# Patient Record
Sex: Female | Born: 1960 | Race: Black or African American | Hispanic: No | Marital: Married | Smoking: Never smoker
Health system: Southern US, Community
[De-identification: ages and names within clinical notes are randomized; demographics above are authoritative.]

## PROBLEM LIST (undated history)

## (undated) DIAGNOSIS — M1990 Unspecified osteoarthritis, unspecified site: Secondary | ICD-10-CM

## (undated) DIAGNOSIS — I10 Essential (primary) hypertension: Secondary | ICD-10-CM

## (undated) HISTORY — DX: Essential (primary) hypertension: I10

## (undated) HISTORY — PX: EYE SURGERY: SHX253

## (undated) HISTORY — PX: ABDOMINAL HYSTERECTOMY: SHX81

## (undated) HISTORY — DX: Unspecified osteoarthritis, unspecified site: M19.90

---

## 2012-05-25 DIAGNOSIS — N920 Excessive and frequent menstruation with regular cycle: Secondary | ICD-10-CM | POA: Insufficient documentation

## 2012-05-25 DIAGNOSIS — Z6834 Body mass index (BMI) 34.0-34.9, adult: Secondary | ICD-10-CM | POA: Insufficient documentation

## 2012-05-25 DIAGNOSIS — D219 Benign neoplasm of connective and other soft tissue, unspecified: Secondary | ICD-10-CM | POA: Insufficient documentation

## 2012-05-25 DIAGNOSIS — Z1231 Encounter for screening mammogram for malignant neoplasm of breast: Secondary | ICD-10-CM | POA: Insufficient documentation

## 2013-02-05 DIAGNOSIS — R9431 Abnormal electrocardiogram [ECG] [EKG]: Secondary | ICD-10-CM | POA: Insufficient documentation

## 2015-08-12 DIAGNOSIS — R0789 Other chest pain: Secondary | ICD-10-CM | POA: Insufficient documentation

## 2018-12-30 DIAGNOSIS — M1710 Unilateral primary osteoarthritis, unspecified knee: Secondary | ICD-10-CM | POA: Insufficient documentation

## 2018-12-30 DIAGNOSIS — M179 Osteoarthritis of knee, unspecified: Secondary | ICD-10-CM | POA: Insufficient documentation

## 2020-08-28 ENCOUNTER — Encounter: Payer: Self-pay | Admitting: Adult Health

## 2020-09-21 ENCOUNTER — Encounter: Payer: Self-pay | Admitting: Adult Health

## 2020-09-21 ENCOUNTER — Ambulatory Visit (INDEPENDENT_AMBULATORY_CARE_PROVIDER_SITE_OTHER): Payer: No Typology Code available for payment source | Admitting: Adult Health

## 2020-09-21 ENCOUNTER — Other Ambulatory Visit: Payer: Self-pay

## 2020-09-21 VITALS — BP 109/62 | HR 86 | Temp 98.7°F | Resp 16 | Ht 64.5 in | Wt 204.0 lb

## 2020-09-21 DIAGNOSIS — K219 Gastro-esophageal reflux disease without esophagitis: Secondary | ICD-10-CM | POA: Insufficient documentation

## 2020-09-21 DIAGNOSIS — Z90710 Acquired absence of both cervix and uterus: Secondary | ICD-10-CM

## 2020-09-21 DIAGNOSIS — D649 Anemia, unspecified: Secondary | ICD-10-CM | POA: Insufficient documentation

## 2020-09-21 DIAGNOSIS — R87619 Unspecified abnormal cytological findings in specimens from cervix uteri: Secondary | ICD-10-CM | POA: Insufficient documentation

## 2020-09-21 DIAGNOSIS — G252 Other specified forms of tremor: Secondary | ICD-10-CM | POA: Diagnosis not present

## 2020-09-21 DIAGNOSIS — Z1231 Encounter for screening mammogram for malignant neoplasm of breast: Secondary | ICD-10-CM

## 2020-09-21 DIAGNOSIS — Z1389 Encounter for screening for other disorder: Secondary | ICD-10-CM

## 2020-09-21 DIAGNOSIS — Z8249 Family history of ischemic heart disease and other diseases of the circulatory system: Secondary | ICD-10-CM | POA: Insufficient documentation

## 2020-09-21 DIAGNOSIS — I10 Essential (primary) hypertension: Secondary | ICD-10-CM

## 2020-09-21 DIAGNOSIS — Z6834 Body mass index (BMI) 34.0-34.9, adult: Secondary | ICD-10-CM | POA: Diagnosis not present

## 2020-09-21 DIAGNOSIS — E559 Vitamin D deficiency, unspecified: Secondary | ICD-10-CM

## 2020-09-21 DIAGNOSIS — Z87898 Personal history of other specified conditions: Secondary | ICD-10-CM

## 2020-09-21 LAB — POCT URINALYSIS DIPSTICK
Blood, UA: NEGATIVE
Glucose, UA: NEGATIVE
Leukocytes, UA: NEGATIVE
Nitrite, UA: NEGATIVE
Protein, UA: POSITIVE — AB
Spec Grav, UA: >=1.03 — AB (ref 1.010–1.025)
Urobilinogen, UA: 0.2 E.U./dL
pH, UA: 5 (ref 5.0–8.0)

## 2020-09-21 MED ORDER — AMLODIPINE BESYLATE 5 MG PO TABS: 2.5000 mg | ORAL_TABLET | Freq: Every day | ORAL | 0 refills | Status: DC

## 2020-09-21 NOTE — Progress Notes (Signed)
New patient visit   Patient: Katherine Santos   DOB: 1961/02/18   60 y.o. Female  MRN: [id number] Visit Date: 09/21/2020  Today's healthcare provider: Marcille Buffy, FNP   Chief Complaint  Patient presents with  . New Patient (Initial Visit)   Subjective    Katherine Santos is a 60 y.o. female who presents today as a new patient to establish care.  HPI  Patient presents in office today to establish care, she states that she feels well today. Patient reports that she does follow a balanced diet of 2-3 meals a day and states that she is staying active by exercising weekly, patient reports that sleep patterns are fair and sleeps on average 6-8hrs a night.  She is taking amlodipine norvasc 5 mg.- not taking in two months.  She is also on spironolactone - HCTZ and she has been out of it for two months.She has had hypertension and now has lost 36 lbs since 2017 reports her blood pressures are around 120/70 or less now without medication.   She has been on the above medicines for years. Denies headaches. Denies any edema.   She only writes with her left hand everything else she does with her right hand.  Denies neck pain or injury.   She repots readings at home that are similar to what she is here in office.   Patinet has seen cardiologist and has had stress test and they were good.   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  Mother died at age 67 years old of myocardial infarction. Was seen at [facility].    Patient states that she would like to address today concern of trembling in her left hand that has been present for over one year. Patient states that her left hand is her dominant hand and states that she  Has noticed as of recent when she grabs an item/object with her left hand she drops it.   atorvastin- joint pain.   Patient  denies any fever, body aches,chills, rash, chest pain, shortness of breath, nausea, vomiting, or diarrhea.   Patient  denies any fever, body  aches,chills, rash, chest pain, shortness of breath, nausea, vomiting, or diarrhea.  Denies dizziness, lightheadedness, pre syncopal or syncopal episodes.   Past Medical History:  Diagnosis Date  . Arthritis   . Hypertension    Past Surgical History:  Procedure Laterality Date  . ABDOMINAL HYSTERECTOMY    . CESAREAN SECTION    . EYE SURGERY     Family Status  Relation Name Status  . Mother  (Not Specified)  . Father  (Not Specified)  . Son  (Not Specified)  . Mat Aunt  (Not Specified)   Family History  Problem Relation Age of Onset  . Hypertension Mother   . Heart attack Mother   . Anemia Mother   . Diabetes Father   . Hypertension Father   . Kidney disease Father   . Asthma Son   . Hypertension Maternal Aunt    Social History   Socioeconomic History  . Marital status: Married    Spouse name: Not on file  . Number of children: Not on file  . Years of education: Not on file  . Highest education level: Not on file  Occupational History  . Not on file  Tobacco Use  . Smoking status: Never Smoker  . Smokeless tobacco: Never Used  Substance and Sexual Activity  . Alcohol use: Never  . Drug use: Never  .  Sexual activity: Not on file  Other Topics Concern  . Not on file  Social History Narrative  . Not on file   Social Determinants of Health   Financial Resource Strain: Not on file  Food Insecurity: Not on file  Transportation Needs: Not on file  Physical Activity: Not on file  Stress: Not on file  Social Connections: Not on file   Outpatient Medications Prior to Visit  Medication Sig  . ASPIRIN 81 PO Take by mouth.  Mariane Baumgarten Sodium (DSS) 100 MG CAPS Take by mouth.  Marland Kitchen ibuprofen (ADVIL) 800 MG tablet Take 800 mg by mouth every 8 (eight) hours as needed.  . TURMERIC PO Take 1,000 mg by mouth.  . [DISCONTINUED] amLODipine (NORVASC) 5 MG tablet Take 5 mg by mouth daily.  . [DISCONTINUED] spironolactone-hydrochlorothiazide (ALDACTAZIDE) 25-25 MG tablet Take  1 tablet by mouth daily.   No facility-administered medications prior to visit.   No Known Allergies   There is no immunization history on file for this patient.  Health Maintenance  Topic Date Due  . COVID-19 Vaccine (1) Never done  . MAMMOGRAM  Never done  . PAP SMEAR-Modifier  09/21/2020 (Originally 05/15/1981)  . INFLUENZA VACCINE  11/09/2020 (Originally 03/12/2020)  . TETANUS/TDAP  09/21/2021 (Originally 05/16/1979)  . HIV Screening  09/21/2021 (Originally 05/16/1975)  . COLONOSCOPY (Pts 45-58yrs Insurance coverage will need to be confirmed)  10/10/2027 (Originally 05/15/2005)  . Hepatitis C Screening  Completed    Patient Care Team: Flinchum, Kelby Aline, FNP as PCP - General (Family Medicine)  Review of Systems  HENT: Positive for dental problem and sore throat.   Gastrointestinal: Positive for constipation.  Musculoskeletal: Positive for arthralgias.  All other systems reviewed and are negative.    Last CBC No results found for: WBC, HGB, HCT, MCV, MCH, RDW, PLT Last metabolic panel No results found for: GLUCOSE, NA, K, CL, CO2, BUN, CREATININE, GFRNONAA, GFRAA, CALCIUM, PHOS, PROT, ALBUMIN, LABGLOB, AGRATIO, BILITOT, ALKPHOS, AST, ALT, ANIONGAP Last lipids No results found for: CHOL, HDL, LDLCALC, LDLDIRECT, TRIG, CHOLHDL Last hemoglobin A1c No results found for: HGBA1C Last thyroid functions No results found for: TSH, T3TOTAL, T4TOTAL, THYROIDAB Last vitamin D No results found for: 25OHVITD2, 25OHVITD3, VD25OH Last vitamin B12 and Folate No results found for: VITAMINB12, FOLATE    Objective    BP 109/62   Pulse 86   Temp 98.7 F (37.1 C) (Oral)   Resp 16   Ht 5' 4.5" (1.638 m)   Wt 204 lb (92.5 kg)   SpO2 100%   BMI 34.48 kg/m  Physical Exam Vitals and nursing note reviewed.  Constitutional:      Appearance: Normal appearance. She is not ill-appearing.  HENT:     Head: Normocephalic and atraumatic.     Right Ear: External ear normal.     Left Ear:  External ear normal.     Nose: Nose normal.     Mouth/Throat:     Mouth: Mucous membranes are moist.  Eyes:     General: No scleral icterus.       Right eye: No discharge.        Left eye: No discharge.     Conjunctiva/sclera: Conjunctivae normal.     Pupils: Pupils are equal, round, and reactive to light.  Cardiovascular:     Rate and Rhythm: Normal rate and regular rhythm.     Pulses: Normal pulses.     Heart sounds: Normal heart sounds. No murmur heard. No friction rub.  No gallop.   Pulmonary:     Effort: Pulmonary effort is normal. No respiratory distress.     Breath sounds: Normal breath sounds. No stridor. No wheezing, rhonchi or rales.  Chest:     Chest wall: No tenderness.  Abdominal:     General: Bowel sounds are normal. There is no distension.     Palpations: Abdomen is soft.     Tenderness: There is no abdominal tenderness. There is no guarding.  Genitourinary:    Comments: Deferred.  Musculoskeletal:        General: No tenderness. Normal range of motion.     Cervical back: Normal range of motion and neck supple.     Right lower leg: No edema.     Left lower leg: No edema.  Skin:    General: Skin is warm.     Findings: No erythema, lesion or rash.  Neurological:     General: No focal deficit present.     Mental Status: She is alert and oriented to person, place, and time.     Motor: No weakness.     Gait: Gait normal.  Psychiatric:        Mood and Affect: Mood normal.        Behavior: Behavior normal.        Thought Content: Thought content normal.        Judgment: Judgment normal.     Depression Screen PHQ 2/9 Scores 09/21/2020  PHQ - 2 Score 0  PHQ- 9 Score 0   Results for orders placed or performed in visit on 09/21/20  POCT urinalysis dipstick  Result Value Ref Range   Color, UA dark orange    Clarity, UA clear    Glucose, UA Negative Negative   Bilirubin, UA small    Ketones, UA trace    Spec Grav, UA >=1.030 (A) 1.010 - 1.025   Blood, UA  negative    pH, UA 5.0 5.0 - 8.0   Protein, UA Positive (A) Negative   Urobilinogen, UA 0.2 0.2 or 1.0 E.U./dL   Nitrite, UA negative    Leukocytes, UA Negative Negative   Appearance     Odor      Assessment & Plan     1. Screening for blood or protein in urine  - POCT urinalysis dipstick  2. Body mass index (BMI) of 34.0-34.9 in adult Continue weight loss efforts and healthy diet/ lifestyle.   3. Vitamin D deficiency Will check lab  4. Fine tremor- left hand.  Recommended further work up she declined at this time, but will check labs, she is trying strengthening exercises and she will let me  Know if persist.  - CBC with Differential/Platelet - TSH - Comprehensive metabolic panel - Lipid Panel w/o Chol/HDL Ratio - B12 - VITAMIN D 25 Hydroxy (Vit-D Deficiency, Fractures)  5. Screening mammogram, encounter for Ordered call [facility] to schedule.  - MM Digital Screening  6. Hypertension, unspecified type She has been off of antihypertensives for almost two months and blood  Pressure today is 109/32 and she has been checking at home getting  the same reading.   Meds ordered this encounter  Medications  . amLODipine (NORVASC) 5 MG tablet    Sig: Take 0.5 tablets (2.5 mg total) by mouth daily.    Dispense:  90 tablet    Refill:  0   Medications Discontinued During This Encounter  Medication Reason  . spironolactone-hydrochlorothiazide (ALDACTAZIDE) 25-25 MG tablet Completed Course  . amLODipine (  NORVASC) 5 MG tablet    7. H/O total hysterectomy Was not for cancerous reasons.   8. History of prediabetes  - HgB A1c  Update covid vaccinations with card at next visit, repeat to follow up on blood pressure in 3 months.  Return in about 3 months (around 12/19/2020), or if symptoms worsen or fail to improve, for Go to Emergency room/ urgent care if worse, at any time for any worsening symptoms.    The entirety of the information documented in the History of Present  Illness, Review of Systems and Physical Exam were personally obtained by me. Portions of this information were initially documented by the CMA and reviewed by me for thoroughness and accuracy.   Red Flags discussed. The patient was given clear instructions to go to ER or return to medical center if any red flags develop, symptoms do not improve, worsen or new problems develop. They verbalized understanding.    Marcille Buffy, [place] [phone] (phone) [phone] (fax)  [place]

## 2020-09-21 NOTE — Patient Instructions (Addendum)
Call to schedule your screening mammogram. Your orders have been placed for your exam.  Let our office know if you have questions, concerns, or any difficulty scheduling.  If normal results then yearly screening mammograms are recommended unless you notice  Changes in your breast then you should schedule a follow up office visit. If abnormal results  Further imaging will be warranted and sooner follow up as determined by the radiologist at the [facility].   [facility] [place], [place] [address]  Main: [phone]    Calorie Counting for Weight Loss Calories are units of energy. Your body needs a certain number of calories from food to keep going throughout the day. When you eat or drink more calories than your body needs, your body stores the extra calories mostly as fat. When you eat or drink fewer calories than your body needs, your body burns fat to get the energy it needs. Calorie counting means keeping track of how many calories you eat and drink each day. Calorie counting can be helpful if you need to lose weight. If you eat fewer calories than your body needs, you should lose weight. Ask your health care provider what a healthy weight is for you. For calorie counting to work, you will need to eat the right number of calories each day to lose a healthy amount of weight per week. A dietitian can help you figure out how many calories you need in a day and will suggest ways to reach your calorie goal.  A healthy amount of weight to lose each week is usually 1-2 lb (0.5-0.9 kg). This usually means that your daily calorie intake should be reduced by 500-750 calories.  Eating 1,200-1,500 calories a day can help most women lose weight.  Eating 1,500-1,800 calories a day can help most men lose weight. What do I need to know about calorie counting? Work with your health care provider or dietitian to determine how many calories you should get  each day. To meet your daily calorie goal, you will need to:  Find out how many calories are in each food that you would like to eat. Try to do this before you eat.  Decide how much of the food you plan to eat.  Keep a food log. Do this by writing down what you ate and how many calories it had. To successfully lose weight, it is important to balance calorie counting with a healthy lifestyle that includes regular activity. Where do I find calorie information? The number of calories in a food can be found on a Nutrition Facts label. If a food does not have a Nutrition Facts label, try to look up the calories online or ask your dietitian for help. Remember that calories are listed per serving. If you choose to have more than one serving of a food, you will have to multiply the calories per serving by the number of servings you plan to eat. For example, the label on a package of bread might say that a serving size is 1 slice and that there are 90 calories in a serving. If you eat 1 slice, you will have eaten 90 calories. If you eat 2 slices, you will have eaten 180 calories.   How do I keep a food log? After each time that you eat, record the following in your food log as soon as possible:  What you ate. Be sure to include toppings, sauces, and other extras  on the food.  How much you ate. This can be measured in cups, ounces, or number of items.  How many calories were in each food and drink.  The total number of calories in the food you ate. Keep your food log near you, such as in a pocket-sized notebook or on an app or website on your mobile phone. Some programs will calculate calories for you and show you how many calories you have left to meet your daily goal. What are some portion-control tips?  Know how many calories are in a serving. This will help you know how many servings you can have of a certain food.  Use a measuring cup to measure serving sizes. You could also try weighing out  portions on a kitchen scale. With time, you will be able to estimate serving sizes for some foods.  Take time to put servings of different foods on your favorite plates or in your favorite bowls and cups so you know what a serving looks like.  Try not to eat straight from a food's packaging, such as from a bag or box. Eating straight from the package makes it hard to see how much you are eating and can lead to overeating. Put the amount you would like to eat in a cup or on a plate to make sure you are eating the right portion.  Use smaller plates, glasses, and bowls for smaller portions and to prevent overeating.  Try not to multitask. For example, avoid watching TV or using your computer while eating. If it is time to eat, sit down at a table and enjoy your food. This will help you recognize when you are full. It will also help you be more mindful of what and how much you are eating. What are tips for following this plan? Reading food labels  Check the calorie count compared with the serving size. The serving size may be smaller than what you are used to eating.  Check the source of the calories. Try to choose foods that are high in protein, fiber, and vitamins, and low in saturated fat, trans fat, and sodium. Shopping  Read nutrition labels while you shop. This will help you make healthy decisions about which foods to buy.  Pay attention to nutrition labels for low-fat or fat-free foods. These foods sometimes have the same number of calories or more calories than the full-fat versions. They also often have added sugar, starch, or salt to make up for flavor that was removed with the fat.  Make a grocery list of lower-calorie foods and stick to it. Cooking  Try to cook your favorite foods in a healthier way. For example, try baking instead of frying.  Use low-fat dairy products. Meal planning  Use more fruits and vegetables. One-half of your plate should be fruits and  vegetables.  Include lean proteins, such as chicken, [place], and fish. Lifestyle Each week, aim to do one of the following:  150 minutes of moderate exercise, such as walking.  75 minutes of vigorous exercise, such as running. General information  Know how many calories are in the foods you eat most often. This will help you calculate calorie counts faster.  Find a way of tracking calories that works for you. Get creative. Try different apps or programs if writing down calories does not work for you. What foods should I eat?  Eat nutritious foods. It is better to have a nutritious, high-calorie food, such as an avocado, than a food  with few nutrients, such as a bag of potato chips.  Use your calories on foods and drinks that will fill you up and will not leave you hungry soon after eating. ? Examples of foods that fill you up are nuts and nut butters, vegetables, lean proteins, and high-fiber foods such as whole grains. High-fiber foods are foods with more than 5 g of fiber per serving.  Pay attention to calories in drinks. Low-calorie drinks include water and unsweetened drinks. The items listed above may not be a complete list of foods and beverages you can eat. Contact a dietitian for more information.   What foods should I limit? Limit foods or drinks that are not good sources of vitamins, minerals, or protein or that are high in unhealthy fats. These include:  Candy.  Other sweets.  Sodas, specialty coffee drinks, alcohol, and juice. The items listed above may not be a complete list of foods and beverages you should avoid. Contact a dietitian for more information. How do I count calories when eating out?  Pay attention to portions. Often, portions are much larger when eating out. Try these tips to keep portions smaller: ? Consider sharing a meal instead of getting your own. ? If you get your own meal, eat only half of it. Before you start eating, ask for a container and put  half of your meal into it. ? When available, consider ordering smaller portions from the menu instead of full portions.  Pay attention to your food and drink choices. Knowing the way food is cooked and what is included with the meal can help you eat fewer calories. ? If calories are listed on the menu, choose the lower-calorie options. ? Choose dishes that include vegetables, fruits, whole grains, low-fat dairy products, and lean proteins. ? Choose items that are boiled, broiled, grilled, or steamed. Avoid items that are buttered, battered, fried, or served with cream sauce. Items labeled as crispy are usually fried, unless stated otherwise. ? Choose water, low-fat milk, unsweetened iced tea, or other drinks without added sugar. If you want an alcoholic beverage, choose a lower-calorie option, such as a glass of wine or light beer. ? Ask for dressings, sauces, and syrups on the side. These are usually high in calories, so you should limit the amount you eat. ? If you want a salad, choose a garden salad and ask for grilled meats. Avoid extra toppings such as bacon, cheese, or fried items. Ask for the dressing on the side, or ask for olive oil and vinegar or lemon to use as dressing.  Estimate how many servings of a food you are given. Knowing serving sizes will help you be aware of how much food you are eating at restaurants. Where to find more information  Centers for Disease Control and Prevention: http://www.wolf.info/  [place] Department of Agriculture: http://www.wilson-mendoza.org/ Summary  Calorie counting means keeping track of how many calories you eat and drink each day. If you eat fewer calories than your body needs, you should lose weight.  A healthy amount of weight to lose per week is usually 1-2 lb (0.5-0.9 kg). This usually means reducing your daily calorie intake by 500-750 calories.  The number of calories in a food can be found on a Nutrition Facts label. If a food does not have a Nutrition Facts label, try  to look up the calories online or ask your dietitian for help.  Use smaller plates, glasses, and bowls for smaller portions and to prevent overeating.  Use your calories on foods and drinks that will fill you up and not leave you hungry shortly after a meal. This information is not intended to replace advice given to you by your health care provider. Make sure you discuss any questions you have with your health care provider. Document Revised: 09/09/2019 Document Reviewed: 09/09/2019 Elsevier Patient Education  2021 [place] and Cholesterol Restricted Eating Plan Getting too much fat and cholesterol in your diet may cause health problems. Choosing the right foods helps keep your fat and cholesterol at normal levels. This can keep you from getting certain diseases. Your doctor may recommend an eating plan that includes:  Total fat: ______% or less of total calories a day.  Saturated fat: ______% or less of total calories a day.  Cholesterol: less than _________mg a day.  Fiber: ______g a day. What are tips for following this plan? Meal planning  At meals, divide your plate into four equal parts: ? Fill one-half of your plate with vegetables and green salads. ? Fill one-fourth of your plate with whole grains. ? Fill one-fourth of your plate with low-fat (lean) protein foods.  Eat fish that is high in omega-3 fats at least two times a week. This includes mackerel, tuna, sardines, and salmon.  Eat foods that are high in fiber, such as whole grains, beans, apples, broccoli, carrots, peas, and barley. General tips  Work with your doctor to lose weight if you need to.  Avoid: ? Foods with added sugar. ? Fried foods. ? Foods with partially hydrogenated oils.  Limit alcohol intake to no more than 1 drink a day for nonpregnant women and 2 drinks a day for men. One drink equals 12 oz of beer, 5 oz of wine, or 1 oz of hard liquor.   Reading food labels  Check food labels  for: ? Trans fats. ? Partially hydrogenated oils. ? Saturated fat (g) in each serving. ? Cholesterol (mg) in each serving. ? Fiber (g) in each serving.  Choose foods with healthy fats, such as: ? Monounsaturated fats. ? Polyunsaturated fats. ? Omega-3 fats.  Choose grain products that have whole grains. Look for the word "whole" as the first word in the ingredient list. Cooking  Cook foods using low-fat methods. These include baking, boiling, grilling, and broiling.  Eat more home-cooked foods. Eat at restaurants and buffets less often.  Avoid cooking using saturated fats, such as butter, cream, palm oil, palm kernel oil, and coconut oil. Recommended foods Fruits  All fresh, canned (in natural juice), or frozen fruits. Vegetables  Fresh or frozen vegetables (raw, steamed, roasted, or grilled). Green salads. Grains  Whole grains, such as whole wheat or whole grain breads, crackers, cereals, and pasta. Unsweetened oatmeal, bulgur, barley, quinoa, or brown rice. Corn or whole wheat flour tortillas. Meats and other protein foods  Ground beef (85% or leaner), grass-fed beef, or beef trimmed of fat. Skinless chicken or [place]. Ground chicken or [place]. Pork trimmed of fat. All fish and seafood. Egg whites. Dried beans, peas, or lentils. Unsalted nuts or seeds. Unsalted canned beans. Nut butters without added sugar or oil. Dairy  Low-fat or nonfat dairy products, such as skim or 1% milk, 2% or reduced-fat cheeses, low-fat and fat-free ricotta or cottage cheese, or plain low-fat and nonfat yogurt. Fats and oils  Tub margarine without trans fats. Light or reduced-fat mayonnaise and salad dressings. Avocado. Olive, canola, sesame, or safflower oils. The items listed above may not be a complete  list of foods and beverages you can eat. Contact a dietitian for more information.   Foods to avoid Fruits  Canned fruit in heavy syrup. Fruit in cream or butter sauce. Fried  fruit. Vegetables  Vegetables cooked in cheese, cream, or butter sauce. Fried vegetables. Grains  White bread. White pasta. White rice. Cornbread. Bagels, pastries, and croissants. Crackers and snack foods that contain trans fat and hydrogenated oils. Meats and other protein foods  Fatty cuts of meat. Ribs, chicken wings, bacon, sausage, bologna, salami, chitterlings, fatback, hot dogs, bratwurst, and packaged lunch meats. Liver and organ meats. Whole eggs and egg yolks. Chicken and [place] with skin. Fried meat. Dairy  Whole or 2% milk, cream, half-and-half, and cream cheese. Whole milk cheeses. Whole-fat or sweetened yogurt. Full-fat cheeses. Nondairy creamers and whipped toppings. Processed cheese, cheese spreads, and cheese curds. Beverages  Alcohol. Sugar-sweetened drinks such as sodas, lemonade, and fruit drinks. Fats and oils  Butter, stick margarine, lard, shortening, ghee, or bacon fat. Coconut, palm kernel, and palm oils. Sweets and desserts  Corn syrup, sugars, honey, and molasses. Candy. Jam and jelly. Syrup. Sweetened cereals. Cookies, pies, cakes, donuts, muffins, and ice cream. The items listed above may not be a complete list of foods and beverages you should avoid. Contact a dietitian for more information. Summary  Choosing the right foods helps keep your fat and cholesterol at normal levels. This can keep you from getting certain diseases.  At meals, fill one-half of your plate with vegetables and green salads.  Eat high-fiber foods, like whole grains, beans, apples, carrots, peas, and barley.  Limit added sugar, saturated fats, alcohol, and fried foods. This information is not intended to replace advice given to you by your health care provider. Make sure you discuss any questions you have with your health care provider. Document Revised: 12/01/2019 Document Reviewed: 12/01/2019 Elsevier Patient Education  2021 [employer].

## 2020-10-07 LAB — CBC WITH DIFFERENTIAL/PLATELET
Basophils Absolute: 0.1 10*3/uL (ref 0.0–0.2)
Basos: 1 %
EOS (ABSOLUTE): 0.2 10*3/uL (ref 0.0–0.4)
Eos: 2 %
Hematocrit: 38.9 % (ref 34.0–46.6)
Hemoglobin: 12.4 g/dL (ref 11.1–15.9)
Immature Grans (Abs): 0 10*3/uL (ref 0.0–0.1)
Immature Granulocytes: 0 %
Lymphocytes Absolute: 1.8 10*3/uL (ref 0.7–3.1)
Lymphs: 28 %
MCH: 27.8 pg (ref 26.6–33.0)
MCHC: 31.9 g/dL (ref 31.5–35.7)
MCV: 87 fL (ref 79–97)
Monocytes Absolute: 0.4 10*3/uL (ref 0.1–0.9)
Monocytes: 7 %
Neutrophils Absolute: 3.9 10*3/uL (ref 1.4–7.0)
Neutrophils: 62 %
Platelets: 265 10*3/uL (ref 150–450)
RBC: 4.46 x10E6/uL (ref 3.77–5.28)
RDW: 12.5 % (ref 11.7–15.4)
WBC: 6.3 10*3/uL (ref 3.4–10.8)

## 2020-10-07 LAB — LIPID PANEL W/O CHOL/HDL RATIO
Cholesterol, Total: 212 mg/dL — ABNORMAL HIGH (ref 100–199)
HDL: 50 mg/dL (ref >39)
LDL Chol Calc (NIH): 152 mg/dL — ABNORMAL HIGH (ref 0–99)
Triglycerides: 54 mg/dL (ref 0–149)
VLDL Cholesterol Cal: 10 mg/dL (ref 5–40)

## 2020-10-07 LAB — COMPREHENSIVE METABOLIC PANEL
ALT: 33 IU/L — ABNORMAL HIGH (ref 0–32)
AST: 26 IU/L (ref 0–40)
Albumin/Globulin Ratio: 1.4 (ref 1.2–2.2)
Albumin: 4.2 g/dL (ref 3.8–4.9)
Alkaline Phosphatase: 105 IU/L (ref 44–121)
BUN/Creatinine Ratio: 18 (ref 12–28)
BUN: 17 mg/dL (ref 8–27)
Bilirubin Total: 0.4 mg/dL (ref 0.0–1.2)
CO2: 23 mmol/L (ref 20–29)
Calcium: 10 mg/dL (ref 8.7–10.3)
Chloride: 105 mmol/L (ref 96–106)
Creatinine, Ser: 0.92 mg/dL (ref 0.57–1.00)
GFR calc Af Amer: 78 mL/min/{1.73_m2} (ref >59)
GFR calc non Af Amer: 68 mL/min/{1.73_m2} (ref >59)
Globulin, Total: 2.9 g/dL (ref 1.5–4.5)
Glucose: 93 mg/dL (ref 65–99)
Potassium: 4.6 mmol/L (ref 3.5–5.2)
Sodium: 143 mmol/L (ref 134–144)
Total Protein: 7.1 g/dL (ref 6.0–8.5)

## 2020-10-07 LAB — VITAMIN D 25 HYDROXY (VIT D DEFICIENCY, FRACTURES): Vit D, 25-Hydroxy: 26.7 ng/mL — ABNORMAL LOW (ref 30.0–100.0)

## 2020-10-07 LAB — TSH: TSH: 1.05 u[IU]/mL (ref 0.450–4.500)

## 2020-10-07 LAB — VITAMIN B12: Vitamin B-12: 857 pg/mL (ref 232–1245)

## 2020-10-11 ENCOUNTER — Other Ambulatory Visit: Payer: Self-pay | Admitting: Adult Health

## 2020-10-11 DIAGNOSIS — E785 Hyperlipidemia, unspecified: Secondary | ICD-10-CM

## 2020-10-11 DIAGNOSIS — E559 Vitamin D deficiency, unspecified: Secondary | ICD-10-CM

## 2020-10-11 MED ORDER — VITAMIN D (ERGOCALCIFEROL) 1.25 MG (50000 UNIT) PO CAPS: 50000.0000 [IU] | ORAL_CAPSULE | ORAL | 0 refills | Status: DC

## 2020-10-11 NOTE — Progress Notes (Signed)
Meds ordered this encounter  Medications  . Vitamin D, Ergocalciferol, (DRISDOL) 1.25 MG (50000 UNIT) CAPS capsule    Sig: Take 1 capsule (50,000 Units total) by mouth every 7 (seven) days. (taking one tablet per week) walk in lab in office 1-2 weeks after completing prescription.    Dispense:  12 capsule    Refill:  0   Orders Placed This Encounter  Procedures  . VITAMIN D 25 Hydroxy (Vit-D Deficiency, Fractures)  . Lipid Panel w/o Chol/HDL Ratio

## 2020-10-11 NOTE — Progress Notes (Signed)
CBC and CMP. Within normal limits.  TSH thyroid level within normal limits. ' Total cholesterol and LDL elevated.  Discuss lifestyle modification with patient e.g. increase exercise, fiber, fruits, vegetables, lean meat, and omega 3/fish intake and decrease saturated fat.  If patient following strict diet and exercise program already please schedule follow up appointment with primary care physician B12 within normal limits.   Vitamin  D is low, this can contribute to poor sleep and fatigue, will send in prescription for Vitamin D at 50,000 units by mouth once every 7 days/(once weekly) for 12 weeks. Advise recheck lab Vitamin D in 1-2 weeks after completing vitamin d prescription. Lab iis walk in and is closed during lunch during regular office hours. Vitamin  D is low, this can contribute to poor sleep and fatigue, will send in prescription for Vitamin D at 50,000 units by mouth once every 7 days/(once weekly) for 12 weeks. Advise recheck lab Vitamin D in 1-2 weeks after completing vitamin d prescription. Lab iis walk in and is closed during lunch during regular office hours.  Recheck lipid panel and vitamin D level around 3 months.

## 2020-10-12 NOTE — Progress Notes (Signed)
Patient questioned if A1C can be added on - it can be but fasting glucose was within normal limits, non diabetic range. Can still add on, however insurance may not cover.

## 2020-10-16 LAB — SPECIMEN STATUS REPORT

## 2020-11-13 ENCOUNTER — Encounter: Payer: Self-pay | Admitting: Adult Health

## 2020-11-14 ENCOUNTER — Other Ambulatory Visit: Payer: Self-pay

## 2020-11-16 ENCOUNTER — Other Ambulatory Visit: Payer: Self-pay | Admitting: Adult Health

## 2020-11-16 ENCOUNTER — Other Ambulatory Visit: Payer: Self-pay

## 2020-11-16 DIAGNOSIS — I10 Essential (primary) hypertension: Secondary | ICD-10-CM

## 2020-11-16 DIAGNOSIS — E559 Vitamin D deficiency, unspecified: Secondary | ICD-10-CM

## 2020-11-16 MED ORDER — ASPIRIN 81 MG PO TBEC
81.0000 mg | DELAYED_RELEASE_TABLET | Freq: Every day | ORAL | 9 refills | Status: DC
  Filled 2020-11-16: qty 60, 60d supply, fill #0

## 2020-11-16 MED ORDER — AMLODIPINE BESYLATE 5 MG PO TABS
2.5000 mg | ORAL_TABLET | Freq: Every day | ORAL | 1 refills | Status: DC
  Filled 2020-11-16: qty 45, 90d supply, fill #0
  Filled 2021-02-15: qty 45, 90d supply, fill #1

## 2020-11-16 MED ORDER — VITAMIN D (ERGOCALCIFEROL) 1.25 MG (50000 UNIT) PO CAPS
50000.0000 [IU] | ORAL_CAPSULE | ORAL | 0 refills | Status: DC
  Filled 2020-11-16: qty 12, 84d supply, fill #0

## 2020-11-16 MED ORDER — DSS 100 MG PO CAPS
1.0000 | ORAL_CAPSULE | Freq: Two times a day (BID) | ORAL | 6 refills | Status: AC
  Filled 2020-11-16: qty 60, fill #0

## 2020-11-16 NOTE — Progress Notes (Signed)
Meds ordered this encounter  Medications  . amLODipine (NORVASC) 5 MG tablet    Sig: Take 0.5 tablets (2.5 mg total) by mouth daily.    Dispense:  90 tablet    Refill:  1  . aspirin (ASPIRIN 81) 81 MG EC tablet    Sig: Take 1 tablet (81 mg total) by mouth daily.    Dispense:  60 tablet    Refill:  9  . Docusate Sodium (DSS) 100 MG CAPS    Sig: Take 1 tablet by mouth 2 (two) times daily.    Dispense:  60 capsule    Refill:  6  . Vitamin D, Ergocalciferol, (DRISDOL) 1.25 MG (50000 UNIT) CAPS capsule    Sig: Take 1 capsule (50,000 Units total) by mouth every 7 (seven) days. (taking one tablet per week) walk in lab in office 1-2 weeks after completing prescription.    Dispense:  12 capsule    Refill:  0

## 2020-11-21 ENCOUNTER — Other Ambulatory Visit: Payer: Self-pay

## 2020-11-23 ENCOUNTER — Other Ambulatory Visit (HOSPITAL_COMMUNITY): Payer: Self-pay

## 2021-02-06 ENCOUNTER — Ambulatory Visit: Payer: Self-pay

## 2021-02-15 ENCOUNTER — Other Ambulatory Visit: Payer: Self-pay | Admitting: Adult Health

## 2021-02-15 ENCOUNTER — Other Ambulatory Visit: Payer: Self-pay

## 2021-02-15 DIAGNOSIS — E559 Vitamin D deficiency, unspecified: Secondary | ICD-10-CM

## 2021-02-16 ENCOUNTER — Other Ambulatory Visit: Payer: Self-pay

## 2021-02-16 ENCOUNTER — Ambulatory Visit: Payer: Self-pay | Attending: Internal Medicine

## 2021-02-16 DIAGNOSIS — Z23 Encounter for immunization: Secondary | ICD-10-CM

## 2021-02-16 NOTE — Progress Notes (Signed)
   Cov[facility] Vaccination [facility]  Name:  Katherine Santos    MRN: [id number] DOB: Jan 26, 1961  02/16/2021  [name] was observed post Covid-19 immunization for 15 minutes without incident. She was provided with Vaccine Information Sheet and instruction to access the V-Safe system.   [name] was instructed to call 911 with any severe reactions post vaccine: Difficulty breathing  Swelling of face and throat  A fast heartbeat  A bad rash all over body  Dizziness and weakness   Immunizations Administered     Name Date Dose VIS Date Route   Moderna Covid-19 Booster Vaccine 02/16/2021  2:18 PM 0.25 mL 05/31/2020 Intramuscular   Manufacturer: Levan Hurst   Lot: [id number]   [place]: [place], PharmD, MBA Clinical Acute Care Pharmacist

## 2021-02-20 ENCOUNTER — Other Ambulatory Visit: Payer: Self-pay

## 2021-02-20 ENCOUNTER — Other Ambulatory Visit: Payer: Self-pay | Admitting: Adult Health

## 2021-02-20 DIAGNOSIS — E559 Vitamin D deficiency, unspecified: Secondary | ICD-10-CM

## 2021-02-20 MED ORDER — VITAMIN D (ERGOCALCIFEROL) 1.25 MG (50000 UNIT) PO CAPS
50000.0000 [IU] | ORAL_CAPSULE | ORAL | 0 refills | Status: DC
  Filled 2021-02-20: qty 12, 84d supply, fill #0

## 2021-02-20 MED ORDER — COVID-19 MRNA VACC (MODERNA) 100 MCG/0.5ML IM SUSP
INTRAMUSCULAR | 0 refills | Status: AC
  Filled 2021-02-20: qty 0.25, 30d supply, fill #0

## 2021-03-05 ENCOUNTER — Other Ambulatory Visit: Payer: Self-pay

## 2021-04-20 ENCOUNTER — Other Ambulatory Visit (HOSPITAL_COMMUNITY): Payer: Self-pay

## 2021-04-20 ENCOUNTER — Ambulatory Visit
Admission: EM | Admit: 2021-04-20 | Discharge: 2021-04-20 | Disposition: A | Discharge status: Home / Self Care | Payer: No Typology Code available for payment source | Attending: Family Medicine | Admitting: Family Medicine

## 2021-04-20 ENCOUNTER — Encounter: Payer: Self-pay | Admitting: Emergency Medicine

## 2021-04-20 ENCOUNTER — Ambulatory Visit (INDEPENDENT_AMBULATORY_CARE_PROVIDER_SITE_OTHER): Payer: No Typology Code available for payment source

## 2021-04-20 ENCOUNTER — Ambulatory Visit: Payer: Self-pay

## 2021-04-20 ENCOUNTER — Other Ambulatory Visit: Payer: Self-pay

## 2021-04-20 DIAGNOSIS — M25561 Pain in right knee: Secondary | ICD-10-CM

## 2021-04-20 DIAGNOSIS — M1711 Unilateral primary osteoarthritis, right knee: Secondary | ICD-10-CM

## 2021-04-20 MED ORDER — MELOXICAM 15 MG PO TABS: 15.0000 mg | ORAL_TABLET | Freq: Every day | ORAL | 0 refills | Status: DC | PRN

## 2021-04-20 NOTE — ED Provider Notes (Signed)
[facility]    CSN: [id number] Arrival date & time: 04/20/21  1653      History   Chief Complaint Chief Complaint  Patient presents with   Knee Pain    right   HPI 60 year old female presents with right knee pain.  Patient reports ongoing right knee pain for the past 3 to 4 weeks.  Patient states that she does not recall an inciting event.  She does note that she has not been as active as she normally is and has gained some weight.  She has used topical diclofenac without resolution.  She states that she recently suffered a fall yesterday when she felt like her knee was going to give out.  Pain 7/10 in severity.  No relieving factors.  Past Medical History:  Diagnosis Date   Arthritis    Hypertension    Patient Active Problem List   Diagnosis Date Noted   Hypertension 09/21/2020   GERD (gastroesophageal reflux disease) 09/21/2020   Family history of cardiovascular disease 09/21/2020   Anemia 09/21/2020   Abnormal Pap smear of cervix 09/21/2020   H/O total hysterectomy 09/21/2020   Vitamin D deficiency 09/21/2020   Fine tremor- left hand.  09/21/2020   Osteoarthritis of knee 12/30/2018   Chest pain, atypical 08/12/2015   Abnormal ECG 02/05/2013   Body mass index (BMI) of 34.0-34.9 in adult 05/25/2012   Menorrhagia 05/25/2012   Fibroids 05/25/2012   Screening mammogram, encounter for 05/25/2012    Past Surgical History:  Procedure Laterality Date   ABDOMINAL HYSTERECTOMY     CESAREAN SECTION     EYE SURGERY      OB History   No obstetric history on file.      Home Medications    Prior to Admission medications   Medication Sig Start Date End Date Taking? Authorizing Provider  amLODipine (NORVASC) 5 MG tablet Take 0.5 tablets (2.5 mg total) by mouth daily. 11/16/20  Yes Flinchum, Kelby Aline, FNP  Docusate Sodium (DSS) 100 MG CAPS Take 1 tablet by mouth 2 (two) times daily. 11/16/20  Yes Flinchum, Kelby Aline, FNP  meloxicam (MOBIC) 15 MG tablet Take 1  tablet (15 mg total) by mouth daily as needed for pain. 04/20/21  Yes Brendin Situ G, DO  TURMERIC PO Take 1,000 mg by mouth.   Yes [provider]  Vitamin D, Ergocalciferol, (DRISDOL) 1.25 MG (50000 UNIT) CAPS capsule Take 1 capsule (50,000 Units total) by mouth every 7 (seven) days. (taking one tablet per week) walk in lab in office 1-2 weeks after completing prescription. 02/20/21  Yes Flinchum, Kelby Aline, FNP  COVID-19 mRNA vaccine, Moderna, 100 MCG/0.5ML injection Inject into the muscle. 02/16/21   Carlyle Basques, MD    Family History Family History  Problem Relation Age of Onset   Hypertension Mother    Heart attack Mother    Anemia Mother    Diabetes Father    Hypertension Father    Kidney disease Father    Asthma Son    Hypertension Maternal Aunt     Social History Social History   Tobacco Use   Smoking status: Never   Smokeless tobacco: Never  Vaping Use   Vaping Use: Never used  Substance Use Topics   Alcohol use: Never   Drug use: Never     Allergies   Patient has no known allergies.   Review of Systems Review of Systems Per HPI  Physical Exam Triage Vital Signs ED Triage Vitals  Enc Vitals  Group     BP 04/20/21 1714 131/83     Pulse Rate 04/20/21 1714 74     Resp 04/20/21 1714 14     Temp 04/20/21 1714 98.4 F (36.9 C)     Temp Source 04/20/21 1714 Oral     SpO2 04/20/21 1714 97 %     Weight 04/20/21 1710 210 lb (95.3 kg)     Height 04/20/21 1710 '5\' 6"'$  (1.676 m)     Head Circumference --      Peak Flow --      Pain Score 04/20/21 1709 7     Pain Loc --      Pain Edu? --      Excl. in [place]? --    Updated Vital Signs BP 131/83 (BP Location: Left Arm)   Pulse 74   Temp 98.4 F (36.9 C) (Oral)   Resp 14   Ht '5\' 6"'$  (1.676 m)   Wt 95.3 kg   SpO2 97%   BMI 33.89 kg/m   Visual Acuity Right Eye Distance:   Left Eye Distance:   Bilateral Distance:    Right Eye Near:   Left Eye Near:    Bilateral Near:     Physical Exam Vitals  and nursing note reviewed.  Constitutional:      General: She is not in acute distress.    Appearance: Normal appearance. She is not ill-appearing.  HENT:     Head: Normocephalic and atraumatic.  Eyes:     General:        Right eye: No discharge.        Left eye: No discharge.     Conjunctiva/sclera: Conjunctivae normal.  Pulmonary:     Effort: Pulmonary effort is normal. No respiratory distress.  Musculoskeletal:     Comments: Right knee -tender over the patella as well as medially.  Mild crepitus.  Ligaments intact.  Neurological:     Mental Status: She is alert.  Psychiatric:        Mood and Affect: Mood normal.        Behavior: Behavior normal.     UC Treatments / Results  Labs (all labs ordered are listed, but only abnormal results are displayed) Labs Reviewed - No data to display  EKG   Radiology DG Knee Complete 4 Views Right  Result Date: 04/20/2021 CLINICAL DATA:  Right knee pain for the past month. EXAM: RIGHT KNEE - COMPLETE 4+ VIEW COMPARISON:  None. FINDINGS: No acute fracture or dislocation. No significant joint effusion. Small tricompartmental marginal osteophytes. Prominent soleal line noted involving the proximal tibia and fibular head. Bone mineralization is normal. Soft tissues are unremarkable. IMPRESSION: 1. Mild tricompartmental degenerative changes. Electronically Signed   By: Titus Dubin M.D.   On: 04/20/2021 18:39    Procedures Procedures (including critical care time)  Medications Ordered in UC Medications - No data to display  Initial Impression / Assessment and Plan / UC Course  I have reviewed the triage vital signs and the nursing notes.  Pertinent labs & imaging results that were available during my care of the patient were reviewed by me and considered in my medical decision making (see chart for details).    60 year old female presents with right knee pain.  X-ray was obtained of his intimately reviewed by me.  Interpretation:  Osteoarthritis noted.  Advised to stay active.  Meloxicam as directed.  Supportive care.  Final Clinical Impressions(s) / UC Diagnoses   Final diagnoses:  Primary osteoarthritis  of right knee   Discharge Instructions   None    ED Prescriptions     Medication Sig Dispense Auth. Provider   meloxicam (MOBIC) 15 MG tablet Take 1 tablet (15 mg total) by mouth daily as needed for pain. 30 tablet Coral Spikes, DO      PDMP not reviewed this encounter.   Coral Spikes, [place] 04/20/21 1900

## 2021-04-20 NOTE — ED Triage Notes (Signed)
Patient c/o right knee pain that started 3 -4 weeks ago. Patient denies injury or fall prior to her pain.

## 2021-05-16 ENCOUNTER — Other Ambulatory Visit: Payer: Self-pay

## 2021-05-16 DIAGNOSIS — M069 Rheumatoid arthritis, unspecified: Secondary | ICD-10-CM

## 2021-05-16 NOTE — Progress Notes (Signed)
Patient referral

## 2021-05-17 ENCOUNTER — Other Ambulatory Visit: Payer: Self-pay

## 2021-05-17 ENCOUNTER — Ambulatory Visit (INDEPENDENT_AMBULATORY_CARE_PROVIDER_SITE_OTHER): Payer: No Typology Code available for payment source | Admitting: Family Medicine

## 2021-05-17 ENCOUNTER — Encounter: Payer: Self-pay | Admitting: Family Medicine

## 2021-05-17 VITALS — BP 124/76 | HR 77 | Temp 98.8°F | Wt 213.0 lb

## 2021-05-17 DIAGNOSIS — I10 Essential (primary) hypertension: Secondary | ICD-10-CM

## 2021-05-17 DIAGNOSIS — M1711 Unilateral primary osteoarthritis, right knee: Secondary | ICD-10-CM | POA: Diagnosis not present

## 2021-05-17 DIAGNOSIS — E559 Vitamin D deficiency, unspecified: Secondary | ICD-10-CM

## 2021-05-17 MED ORDER — AMLODIPINE BESYLATE 5 MG PO TABS
5.0000 mg | ORAL_TABLET | Freq: Every day | ORAL | 1 refills | Status: AC
  Filled 2021-05-17: qty 90, 90d supply, fill #0
  Filled 2021-07-27: qty 90, 90d supply, fill #1

## 2021-05-17 MED ORDER — VITAMIN D (ERGOCALCIFEROL) 1.25 MG (50000 UNIT) PO CAPS
50000.0000 [IU] | ORAL_CAPSULE | ORAL | 1 refills | Status: AC
  Filled 2021-05-17: qty 12, 84d supply, fill #0
  Filled 2021-07-27: qty 12, 84d supply, fill #1

## 2021-05-17 MED ORDER — MELOXICAM 15 MG PO TABS
15.0000 mg | ORAL_TABLET | Freq: Every day | ORAL | 1 refills | Status: AC | PRN
  Filled 2021-05-17: qty 90, 90d supply, fill #0

## 2021-05-17 NOTE — Progress Notes (Signed)
Established patient visit   Patient: Katherine Santos   DOB: September 05, 1960   60 y.o. Female  MRN: [id number] Visit Date: 05/17/2021  Today's healthcare provider: Wilhemena Durie, MD   No chief complaint on file.  Subjective    Knee Pain  There was no injury mechanism. The pain is present in the left knee and right knee. Pertinent negatives include no loss of motion, loss of sensation or numbness.   Pt had arthroscopy 7 years ago. Hypertension, follow-up  BP Readings from Last 3 Encounters:  04/20/21 131/83  09/21/20 109/62   Wt Readings from Last 3 Encounters:  04/20/21 210 lb (95.3 kg)  09/21/20 204 lb (92.5 kg)     She was last seen for hypertension 8 months ago.  BP at that visit was 109/62. Management since that visit includes no changes.  Pt states her bp has been running high and she increased her amlodipine to 5mg  daily.  Since then her bp is normal.  Pt would also like to restart HCTZ she feels like she is retaining fluid.   She reports excellent compliance with treatment. She is not having side effects.  She is following a Low Sodium diet. She is not exercising. She does not smoke.  Use of agents associated with hypertension: none.   Outside blood pressures are normal since increasing amlodipine to 5mg  daily. Symptoms: No chest pain No chest pressure  No palpitations No syncope  No dyspnea No orthopnea  No paroxysmal nocturnal dyspnea No lower extremity edema   Pertinent labs: Lab Results  Component Value Date   CHOL 212 (H) 10/06/2020   HDL 50 10/06/2020   LDLCALC 152 (H) 10/06/2020   TRIG 54 10/06/2020   Lab Results  Component Value Date   NA 143 10/06/2020   K 4.6 10/06/2020   CREATININE 0.92 10/06/2020   GFRNONAA 68 10/06/2020   GLUCOSE 93 10/06/2020     The 10-year ASCVD risk score (Arnett DK, et al., 2019) is: 8.5%    ---------------------------------------------------------------------------------------------------     Medications: Outpatient Medications Prior to Visit  Medication Sig   amLODipine (NORVASC) 5 MG tablet Take 0.5 tablets (2.5 mg total) by mouth daily.   COVID-19 mRNA vaccine, Moderna, 100 MCG/0.5ML injection Inject into the muscle.   Docusate Sodium (DSS) 100 MG CAPS Take 1 tablet by mouth 2 (two) times daily.   meloxicam (MOBIC) 15 MG tablet Take 1 tablet (15 mg total) by mouth daily as needed for pain.   TURMERIC PO Take 1,000 mg by mouth.   Vitamin D, Ergocalciferol, (DRISDOL) 1.25 MG (50000 UNIT) CAPS capsule Take 1 capsule (50,000 Units total) by mouth every 7 (seven) days. (taking one tablet per week) walk in lab in office 1-2 weeks after completing prescription.   No facility-administered medications prior to visit.    Review of Systems  Constitutional: Negative.   Respiratory: Negative.    Cardiovascular: Negative.   Gastrointestinal:  Positive for constipation. Negative for abdominal distention, abdominal pain, anal bleeding, blood in stool, diarrhea, nausea, rectal pain and vomiting.  Neurological:  Negative for dizziness, seizures, speech difficulty, light-headedness and numbness.      Objective    There were no vitals taken for this visit.   Physical Exam Vitals reviewed.  Constitutional:      General: She is not in acute distress.    Appearance: She is well-developed.  HENT:     Head: Normocephalic and atraumatic.     Right Ear: Hearing  normal.     Left Ear: Hearing normal.     Nose: Nose normal.  Eyes:     General: Lids are normal. No scleral icterus.       Right eye: No discharge.        Left eye: No discharge.     Conjunctiva/sclera: Conjunctivae normal.  Cardiovascular:     Rate and Rhythm: Normal rate and regular rhythm.     Heart sounds: Normal heart sounds.  Pulmonary:     Effort: Pulmonary effort is normal. No respiratory distress.   Musculoskeletal:        General: Tenderness present. No swelling.     Comments: Mildly tender along right knee joint line.  Skin:    General: Skin is warm and dry.     Findings: No lesion or rash.  Neurological:     General: No focal deficit present.     Mental Status: She is alert and oriented to person, place, and time.  Psychiatric:        Mood and Affect: Mood normal.        Speech: Speech normal.        Behavior: Behavior normal.        Thought Content: Thought content normal.        Judgment: Judgment normal.      No results found for any visits on 05/17/21.  Assessment & Plan     1. Primary osteoarthritis of right knee Refer to ortho for chronic OA.  2. Hypertension, unspecified type RF Amlodipine. - amLODipine (NORVASC) 5 MG tablet; Take 1 tablet (5 mg total) by mouth daily.  Dispense: 90 tablet; Refill: 1  3. Vitamin D deficiency  - Vitamin D, Ergocalciferol, (DRISDOL) 1.25 MG (50000 UNIT) CAPS capsule; Take 1 capsule (50,000 Units total) by mouth every 7 (seven) days. (taking one tablet per week) walk in lab in office 1-2 weeks after completing prescription.  Dispense: 12 capsule; Refill: 1   No follow-ups on file.      I, Wilhemena Durie, MD, have reviewed all documentation for this visit. The documentation on 05/20/21 for the exam, diagnosis, procedures, and orders are all accurate and complete.    Oakley Kossman Cranford Mon, MD  [facility] [phone] (phone) [phone] (fax)  [place]

## 2021-05-21 ENCOUNTER — Other Ambulatory Visit: Payer: Self-pay

## 2021-05-21 DIAGNOSIS — M170 Bilateral primary osteoarthritis of knee: Secondary | ICD-10-CM

## 2021-05-21 NOTE — Progress Notes (Signed)
Ortho referral  

## 2021-07-27 ENCOUNTER — Other Ambulatory Visit: Payer: Self-pay

## 2021-07-30 ENCOUNTER — Other Ambulatory Visit: Payer: Self-pay

## 2021-09-06 ENCOUNTER — Ambulatory Visit: Payer: No Typology Code available for payment source | Admitting: Physician Assistant

## 2021-09-06 NOTE — Progress Notes (Deleted)
°  ° ° °  Established patient visit   Patient: Katherine Santos   DOB: 13-Nov-1960   61 y.o. Female  MRN: [id number] Visit Date: 09/06/2021  Today's healthcare provider: Mikey Kirschner, PA-C   No chief complaint on file.  Subjective    HPI  Hypertension, follow-up  BP Readings from Last 3 Encounters:  05/17/21 124/76  04/20/21 131/83  09/21/20 109/62   Wt Readings from Last 3 Encounters:  05/17/21 213 lb (96.6 kg)  04/20/21 210 lb (95.3 kg)  09/21/20 204 lb (92.5 kg)     She was last seen for hypertension 3 months ago.  BP at that visit was 124/76. Management since that visit includes continue amlodipine.  She reports {excellent/good/fair/poor:19665} compliance with treatment. She {is/is not:9024} having side effects. {document side effects if present:1} She is following a {diet:21022986} diet. She {is/is not:9024} exercising. She {does/does not:200015} smoke.  Use of agents associated with hypertension: {bp agents assoc with hypertension:511::"none"}.   Outside blood pressures are {***enter patient reported home BP readings, or 'not being checked':1}. Symptoms: {Yes/No:20286} chest pain {Yes/No:20286} chest pressure  {Yes/No:20286} palpitations {Yes/No:20286} syncope  {Yes/No:20286} dyspnea {Yes/No:20286} orthopnea  {Yes/No:20286} paroxysmal nocturnal dyspnea {Yes/No:20286} lower extremity edema   Pertinent labs: Lab Results  Component Value Date   CHOL 212 (H) 10/06/2020   HDL 50 10/06/2020   LDLCALC 152 (H) 10/06/2020   TRIG 54 10/06/2020   Lab Results  Component Value Date   NA 143 10/06/2020   K 4.6 10/06/2020   CREATININE 0.92 10/06/2020   GFRNONAA 68 10/06/2020   GLUCOSE 93 10/06/2020   TSH 1.050 10/06/2020     The 10-year ASCVD risk score (Arnett DK, et al., 2019) is: 7.3%   ---------------------------------------------------------------------------------------------------   Medications: Outpatient Medications Prior to Visit  Medication Sig    amLODipine (NORVASC) 5 MG tablet Take 1 tablet (5 mg total) by mouth daily.   COVID-19 mRNA vaccine, Moderna, 100 MCG/0.5ML injection Inject into the muscle.   Docusate Sodium (DSS) 100 MG CAPS Take 1 tablet by mouth 2 (two) times daily.   meloxicam (MOBIC) 15 MG tablet Take 1 tablet (15 mg total) by mouth daily as needed for pain.   TURMERIC PO Take 1,000 mg by mouth.   Vitamin D, Ergocalciferol, (DRISDOL) 1.25 MG (50000 UNIT) CAPS capsule Take 1 capsule (50,000 Units total) by mouth every 7 (seven) days. (taking one tablet per week) walk in lab in office 1-2 weeks after completing prescription.   No facility-administered medications prior to visit.    Review of Systems  {Labs   Heme   Chem   Endocrine   Serology   Results Review (optional):23779}   Objective    There were no vitals taken for this visit. {Show previous vital signs (optional):23777}  Physical Exam  ***  No results found for any visits on 09/06/21.  Assessment & Plan     ***  No follow-ups on file.      {provider attestation***:1}   Mikey Kirschner, PA-C  [facility] [phone] (phone) [phone] (fax)  [place]

## 2021-10-15 ENCOUNTER — Telehealth: Payer: Self-pay | Admitting: Physician Assistant

## 2021-10-15 NOTE — Telephone Encounter (Signed)
[facility] faxed refill request for the following medications:   amLODipine (NORVASC) 5 MG tablet   Please advise.  

## 2021-10-16 ENCOUNTER — Other Ambulatory Visit: Payer: Self-pay

## 2021-10-16 DIAGNOSIS — I10 Essential (primary) hypertension: Secondary | ICD-10-CM

## 2023-04-29 IMAGING — CR DG KNEE COMPLETE 4+V*R*
4 series · 4 of 4 positions shown · non-contrast
Comparison: None.

CLINICAL DATA: Right knee pain for the past month.

EXAM:
RIGHT KNEE - COMPLETE 4+ VIEW

[knee ap]
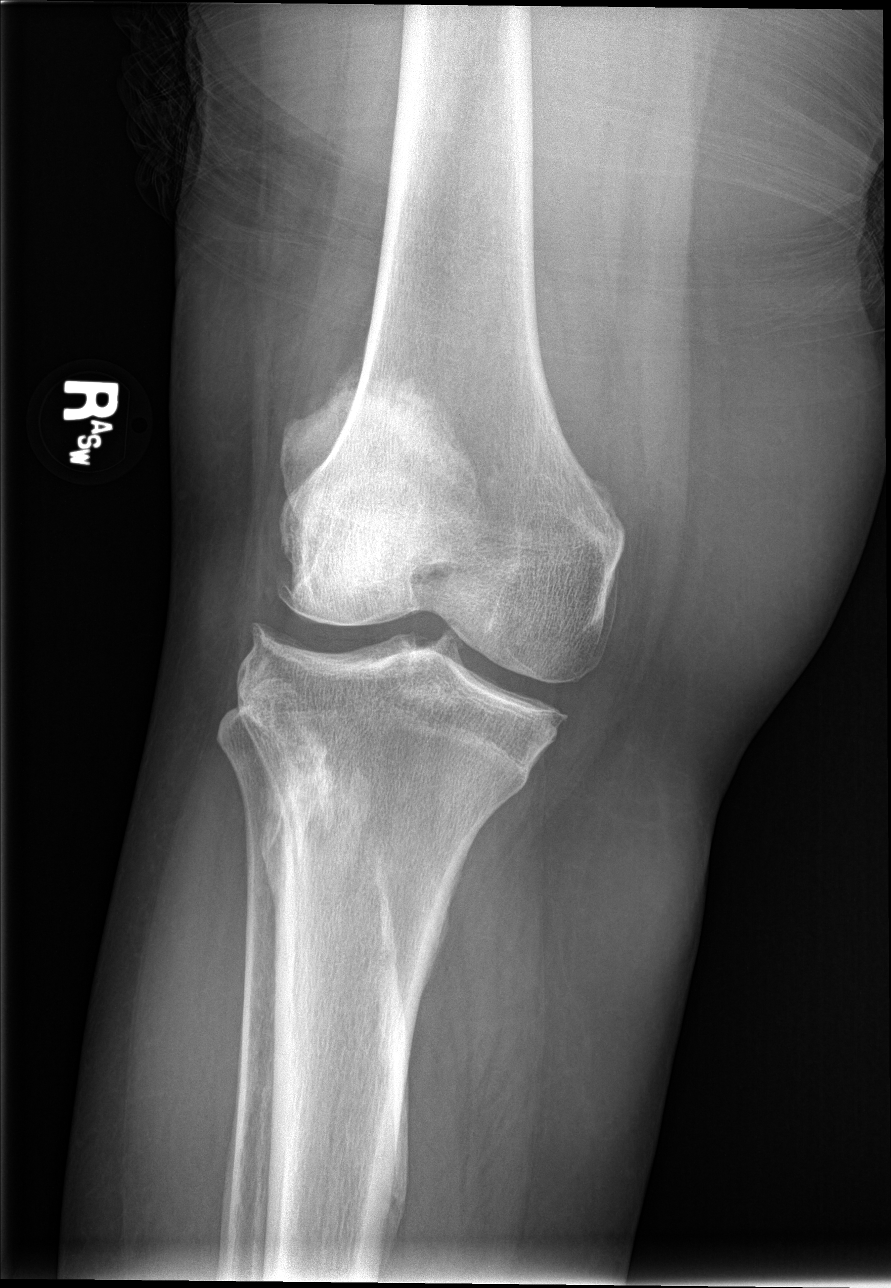

[knee obl (1 of 2)]
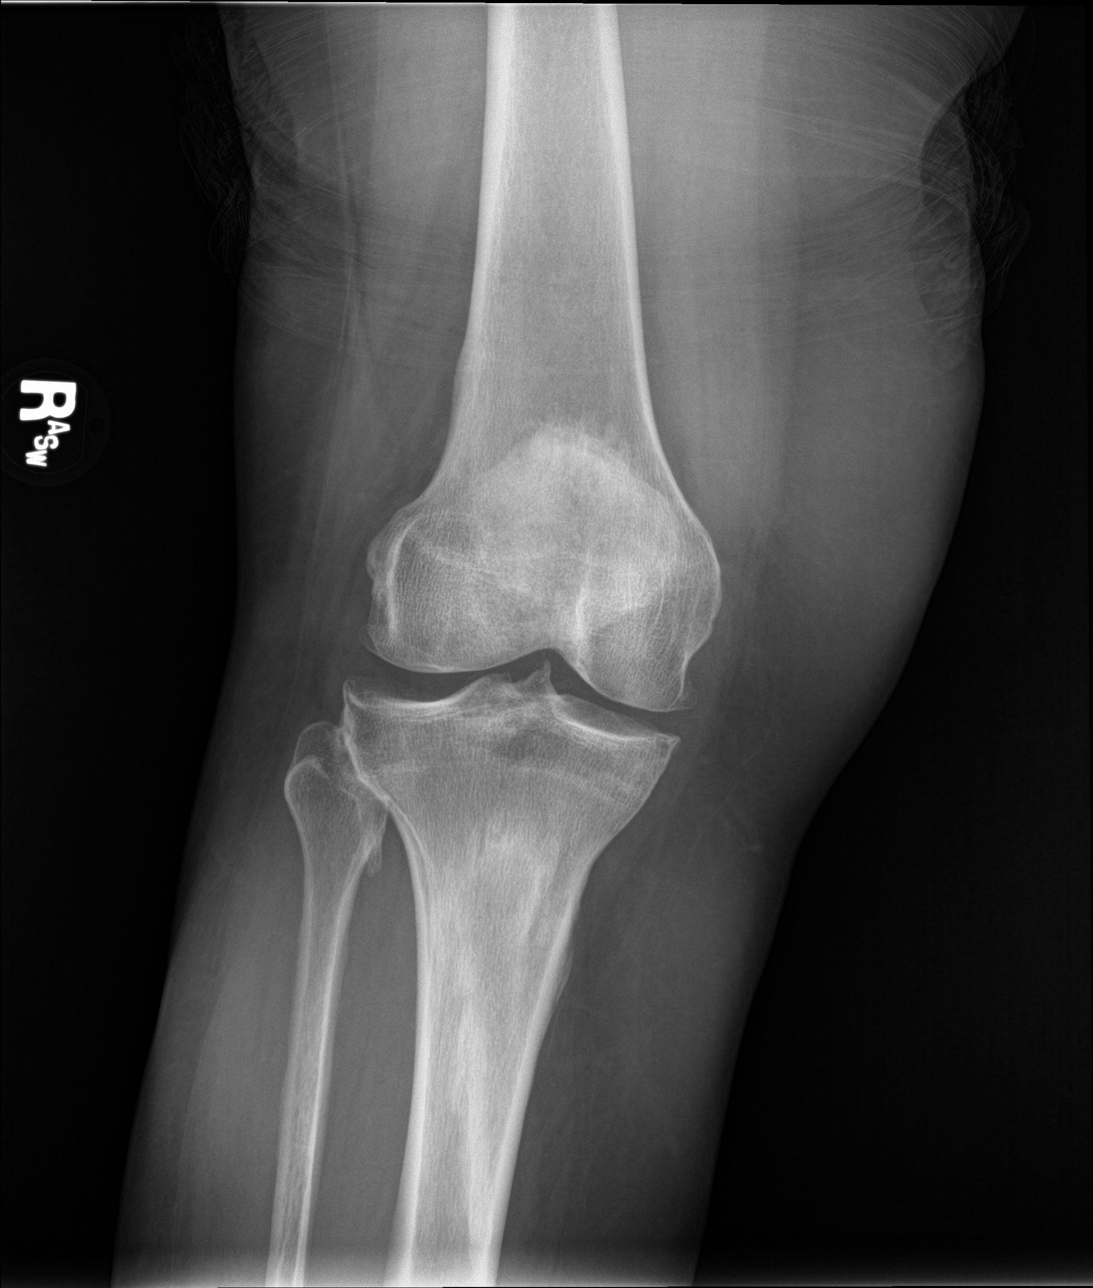

[knee obl (2 of 2)]
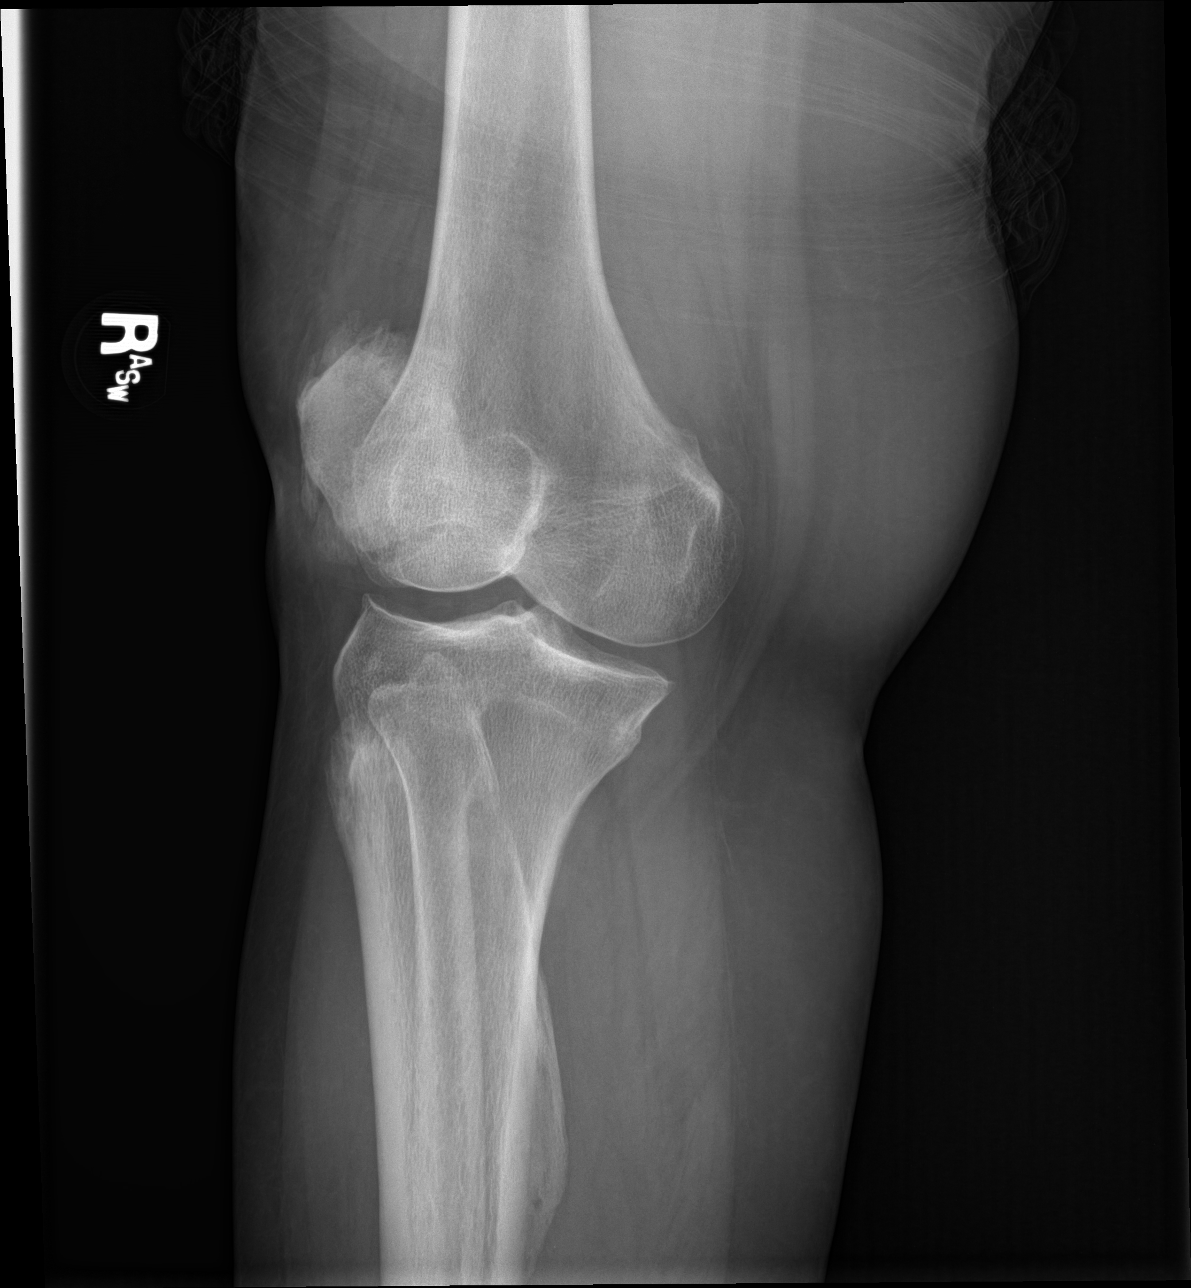

[knee lat]
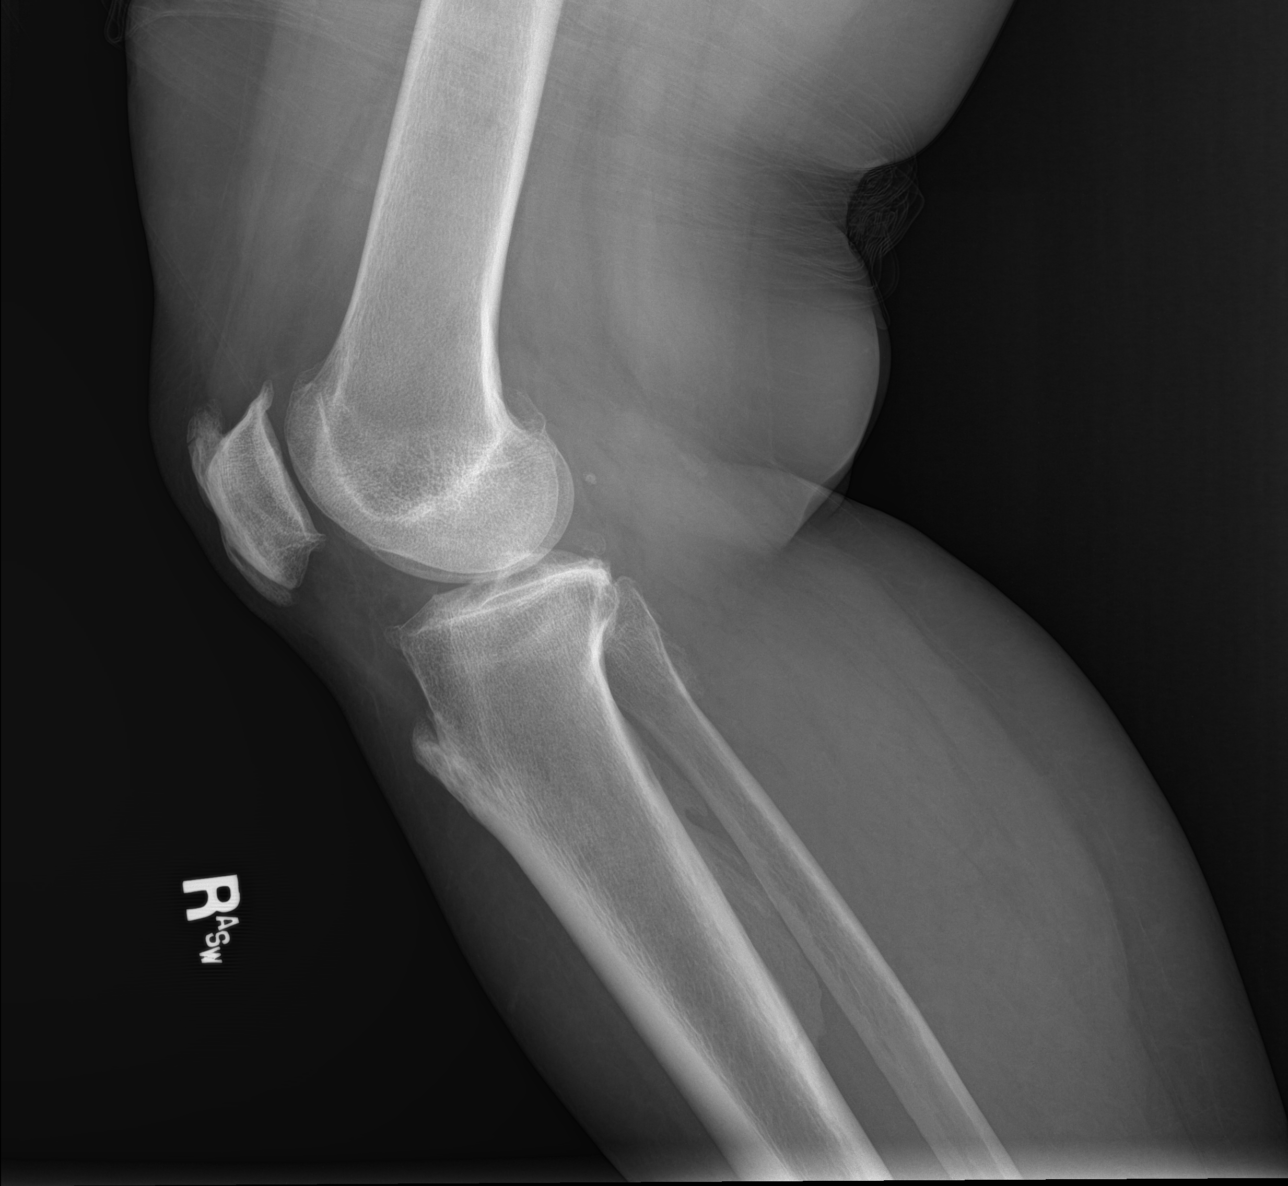

[4 of 4 positions shown; findings below may reference images not displayed]

FINDINGS: No acute fracture or dislocation. No significant joint effusion.
Small tricompartmental marginal osteophytes. Prominent soleal line
noted involving the proximal tibia and fibular head. Bone
mineralization is normal. Soft tissues are unremarkable.
IMPRESSION: 1. Mild tricompartmental degenerative changes.

## 2023-06-10 ENCOUNTER — Other Ambulatory Visit: Payer: Self-pay

## 2023-06-10 MED ORDER — COMIRNATY 30 MCG/0.3ML IM SUSY
0.3000 mL | PREFILLED_SYRINGE | Freq: Once | INTRAMUSCULAR | 0 refills | Status: AC
  Filled 2023-06-10: qty 0.3, 1d supply, fill #0

## 2023-06-23 ENCOUNTER — Other Ambulatory Visit: Payer: Self-pay

## 2024-01-12 ENCOUNTER — Ambulatory Visit: Payer: Self-pay | Attending: Physician Assistant

## 2024-01-12 DIAGNOSIS — M25552 Pain in left hip: Secondary | ICD-10-CM | POA: Insufficient documentation

## 2024-01-12 NOTE — Therapy (Signed)
 PT/OT/SLP Screening Form   Time: in___9:31___     Time out___10:05__   Complaint: Left posterior hip pain Past Medical Hx:  HTN, knee arthritis Injury Date:2 weeks ago- Patient reports was having some knee pain. Active bowler and started a new technique including stance on LLE  Pain Scale: Current = 0/10; Worst= 9/10; best 0/10 Left posterior thigh pain Patient's phone number:   Hx (this occurrence):  Left hip pain started around 2 weeks ago.  Current= 0/10; Worst 9/10; best= 0/10-Describes pain as sudden, sharp pain- feels like my lm going to fall Better with rest/sitting- then when I get back up - it's achy and then starts with sharp pain over again.  Taking tylenol arthritis, BP meds, multivitamin, tumeric, glucosamine and using Voltaren gel as needed.  Previous injection in bilateral knees- last received in Oct 2024. 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 Active bowler- practices 2days/week, competitive 2 days.     Assessment:  Full AROM left hip  (painful with knee to chest in supine; piriformis stretch and supine Hamstring stretch)  5/5 Left LE hip flex (Supine); left hip abd (sidelye); seated knee flex/ext (+) pain/tenderness  with posterior proximal thigh near Hamstring insertion  Patient is a 63 year old female with self reported acute posterior thigh pain over past 2 weeks. Patient denies any specific mechanism of injury. She presents with posterior proximal thigh pain  in proximal hamstring origin region just off the Ischial Tuberosity. She responded with pain with passive and active Hamstring movement and instructed in some basic gentle pain- free stretching and strengthening activities today. She already has an Appt set up with PCP on Friday for further evaluation. See recommendations listed below.      Recommendations:    Comments: instructed in gentle (pain-free) seated hamstring stretch - hold 30 sec x 3 on  Left and pain-free seated hamstring set (hold 5 sec) x 10       [x]  Patient would benefit from an MD referral- already has appt on Friday 6/6 [x]  Patient would benefit from a full PT/OT/ SLP evaluation and treatment. []  No intervention recommended at this time.    Ossie Blend, PT Physical Therapist [place] at [facility] [phone]
